# Patient Record
Sex: Female | Born: 1950 | Race: White | Hispanic: No
Health system: Southern US, Community
[De-identification: ages and names within clinical notes are randomized; demographics above are authoritative.]

## PROBLEM LIST (undated history)

## (undated) DIAGNOSIS — G4730 Sleep apnea, unspecified: Secondary | ICD-10-CM

## (undated) DIAGNOSIS — M1990 Unspecified osteoarthritis, unspecified site: Secondary | ICD-10-CM

## (undated) DIAGNOSIS — G4733 Obstructive sleep apnea (adult) (pediatric): Secondary | ICD-10-CM

## (undated) HISTORY — PX: BUNIONECTOMY: SHX129

## (undated) HISTORY — PX: COLONOSCOPY: SHX174

---

## 2004-06-29 DIAGNOSIS — R42 Dizziness and giddiness: Secondary | ICD-10-CM

## 2004-06-29 HISTORY — DX: Dizziness and giddiness: R42

## 2005-01-22 ENCOUNTER — Ambulatory Visit: Payer: Self-pay | Admitting: Family Medicine

## 2005-04-21 ENCOUNTER — Emergency Department: Payer: Self-pay | Admitting: Unknown Physician Specialty

## 2005-04-21 ENCOUNTER — Other Ambulatory Visit: Payer: Self-pay

## 2005-04-21 ENCOUNTER — Emergency Department: Payer: Self-pay | Admitting: Emergency Medicine

## 2005-09-29 ENCOUNTER — Encounter: Payer: Self-pay | Admitting: Family Medicine

## 2005-10-27 ENCOUNTER — Encounter: Payer: Self-pay | Admitting: Family Medicine

## 2005-11-27 ENCOUNTER — Encounter: Payer: Self-pay | Admitting: Family Medicine

## 2005-12-27 ENCOUNTER — Encounter: Payer: Self-pay | Admitting: Family Medicine

## 2006-01-27 ENCOUNTER — Encounter: Payer: Self-pay | Admitting: Family Medicine

## 2006-02-25 ENCOUNTER — Ambulatory Visit: Payer: Self-pay | Admitting: Family Medicine

## 2006-02-27 ENCOUNTER — Encounter: Payer: Self-pay | Admitting: Family Medicine

## 2006-03-29 ENCOUNTER — Encounter: Payer: Self-pay | Admitting: Family Medicine

## 2006-04-29 ENCOUNTER — Encounter: Payer: Self-pay | Admitting: Family Medicine

## 2007-11-07 ENCOUNTER — Other Ambulatory Visit: Payer: Self-pay

## 2007-11-07 ENCOUNTER — Emergency Department: Payer: Self-pay | Admitting: Emergency Medicine

## 2008-05-29 ENCOUNTER — Ambulatory Visit: Payer: Self-pay | Admitting: Family Medicine

## 2010-10-24 ENCOUNTER — Ambulatory Visit: Payer: Self-pay | Admitting: Gastroenterology

## 2012-08-16 ENCOUNTER — Ambulatory Visit: Payer: Self-pay | Admitting: General Practice

## 2013-03-14 ENCOUNTER — Ambulatory Visit: Payer: Self-pay | Admitting: Family Medicine

## 2013-03-22 ENCOUNTER — Ambulatory Visit: Payer: Self-pay | Admitting: Family Medicine

## 2013-10-12 ENCOUNTER — Ambulatory Visit: Payer: Self-pay | Admitting: Family Medicine

## 2014-03-20 ENCOUNTER — Ambulatory Visit: Payer: Self-pay | Admitting: Family Medicine

## 2014-05-11 ENCOUNTER — Emergency Department: Payer: Self-pay | Admitting: Emergency Medicine

## 2014-05-11 LAB — COMPREHENSIVE METABOLIC PANEL
ALBUMIN: 3.1 g/dL — AB (ref 3.4–5.0)
AST: 30 U/L (ref 15–37)
Alkaline Phosphatase: 97 U/L
Anion Gap: 4 — ABNORMAL LOW (ref 7–16)
BUN: 13 mg/dL (ref 7–18)
Bilirubin,Total: 0.4 mg/dL (ref 0.2–1.0)
CO2: 29 mmol/L (ref 21–32)
CREATININE: 0.94 mg/dL (ref 0.60–1.30)
Calcium, Total: 8.1 mg/dL — ABNORMAL LOW (ref 8.5–10.1)
Chloride: 107 mmol/L (ref 98–107)
EGFR (African American): >60
EGFR (Non-African Amer.): >60
Glucose: 112 mg/dL — ABNORMAL HIGH (ref 65–99)
OSMOLALITY: 280 (ref 275–301)
Potassium: 4.1 mmol/L (ref 3.5–5.1)
SGPT (ALT): 25 U/L
SODIUM: 140 mmol/L (ref 136–145)
Total Protein: 6.7 g/dL (ref 6.4–8.2)

## 2014-05-11 LAB — CK TOTAL AND CKMB (NOT AT ARMC)
CK, Total: 50 U/L
CK-MB: 0.5 ng/mL (ref 0.5–3.6)

## 2014-05-11 LAB — CBC
HCT: 43 % (ref 35.0–47.0)
HGB: 13.9 g/dL (ref 12.0–16.0)
MCH: 27.4 pg (ref 26.0–34.0)
MCHC: 32.4 g/dL (ref 32.0–36.0)
MCV: 85 fL (ref 80–100)
Platelet: 229 10*3/uL (ref 150–440)
RBC: 5.08 10*6/uL (ref 3.80–5.20)
RDW: 13.8 % (ref 11.5–14.5)
WBC: 7.7 10*3/uL (ref 3.6–11.0)

## 2014-05-11 LAB — TROPONIN I: Troponin-I: <0.02 ng/mL

## 2015-03-15 ENCOUNTER — Other Ambulatory Visit: Payer: Self-pay | Admitting: Family Medicine

## 2015-03-15 DIAGNOSIS — R921 Mammographic calcification found on diagnostic imaging of breast: Secondary | ICD-10-CM

## 2015-03-28 ENCOUNTER — Ambulatory Visit: Payer: Medicare Other

## 2015-03-28 ENCOUNTER — Ambulatory Visit
Admission: RE | Admit: 2015-03-28 | Discharge: 2015-03-28 | Disposition: A | Discharge status: Home / Self Care | Payer: Medicare Other | Source: Ambulatory Visit | Attending: Family Medicine | Admitting: Family Medicine

## 2015-03-28 DIAGNOSIS — R921 Mammographic calcification found on diagnostic imaging of breast: Secondary | ICD-10-CM | POA: Diagnosis not present

## 2015-09-26 IMAGING — CR DG CHEST 2V
1 series · 2 of 2 positions shown · non-contrast
Comparison: None.

CLINICAL DATA: Chest pain

EXAM:
CHEST  2 VIEW

[Series 1: w chest pa · 0.14mm/px · 2 of 2 slices shown]
[im 1/2]
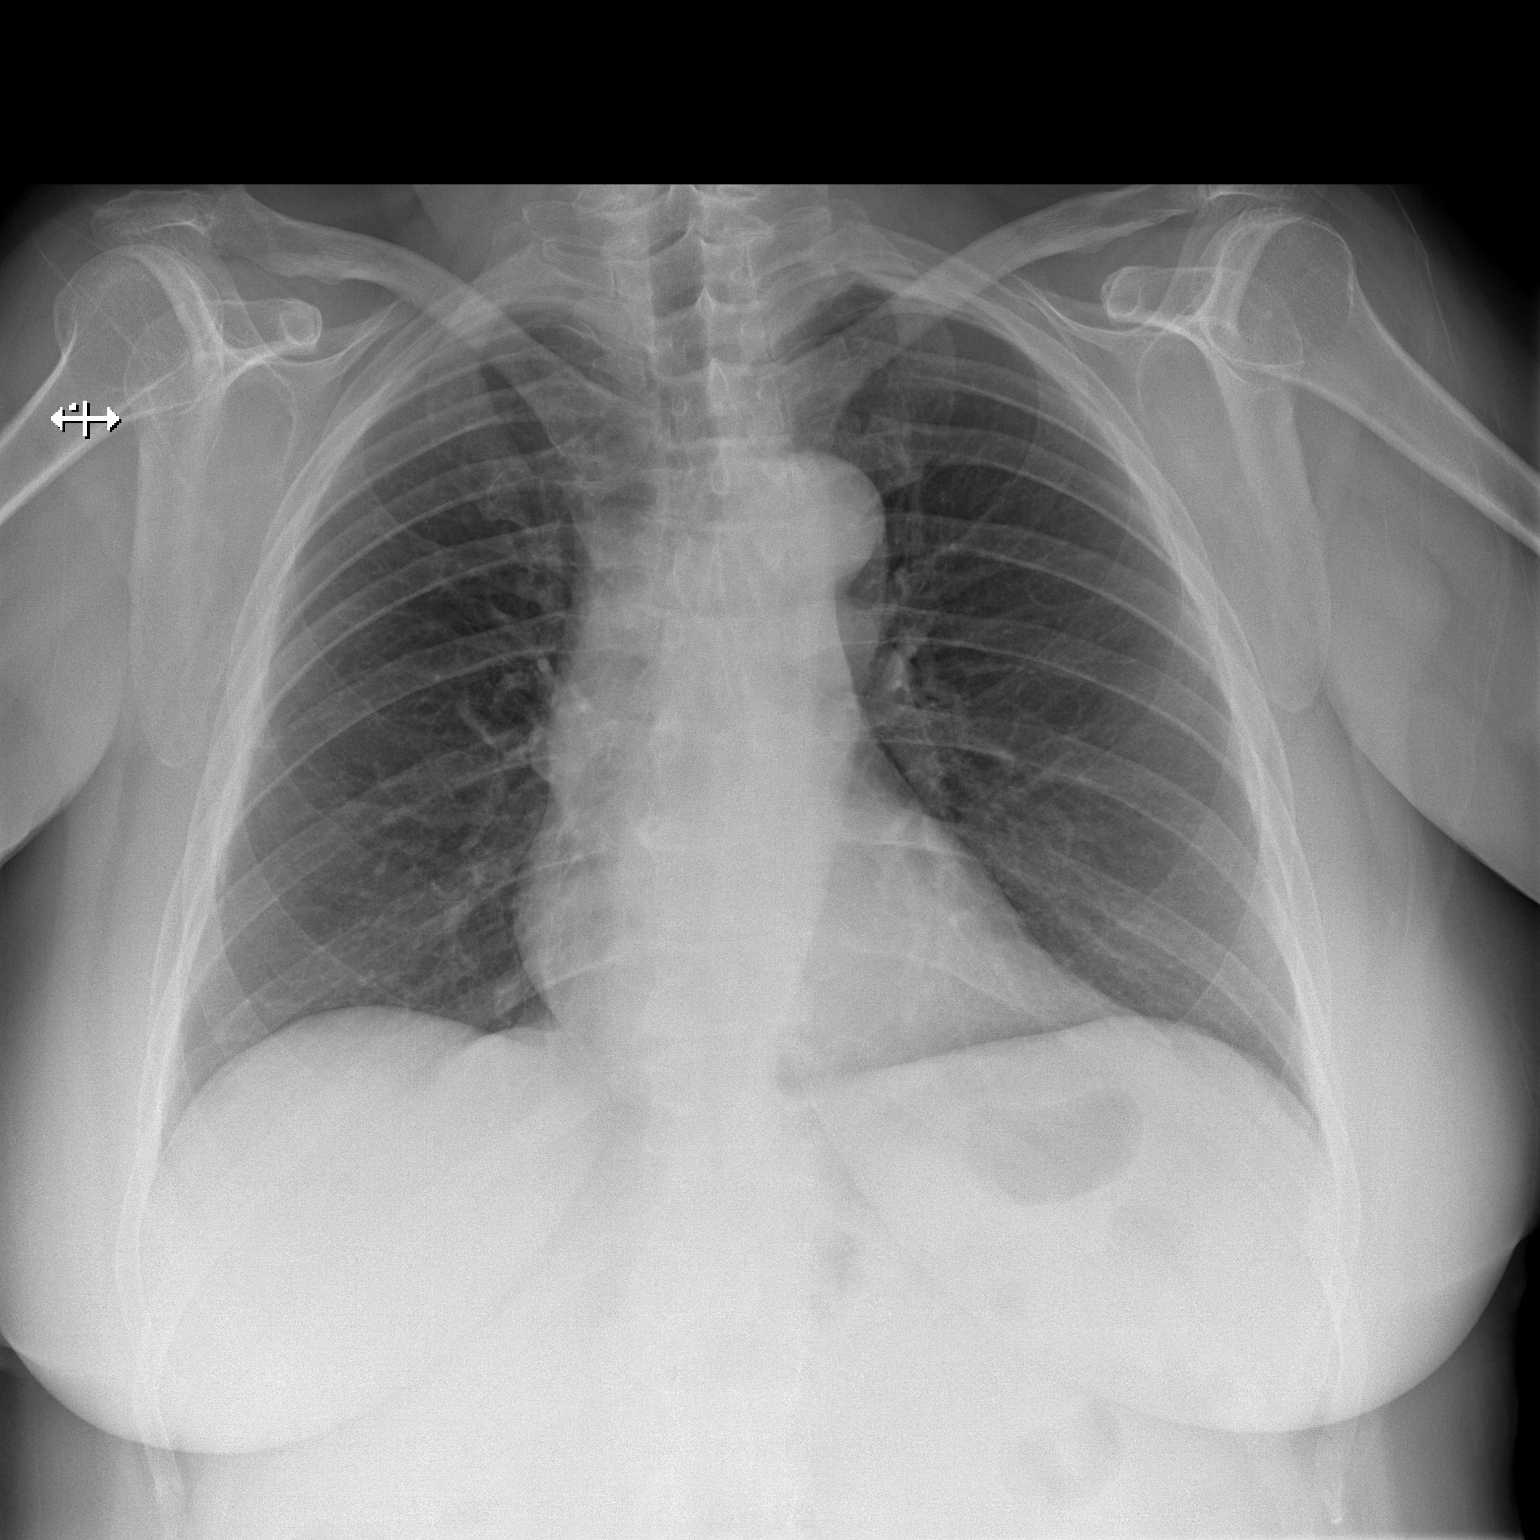
[im 2/2]
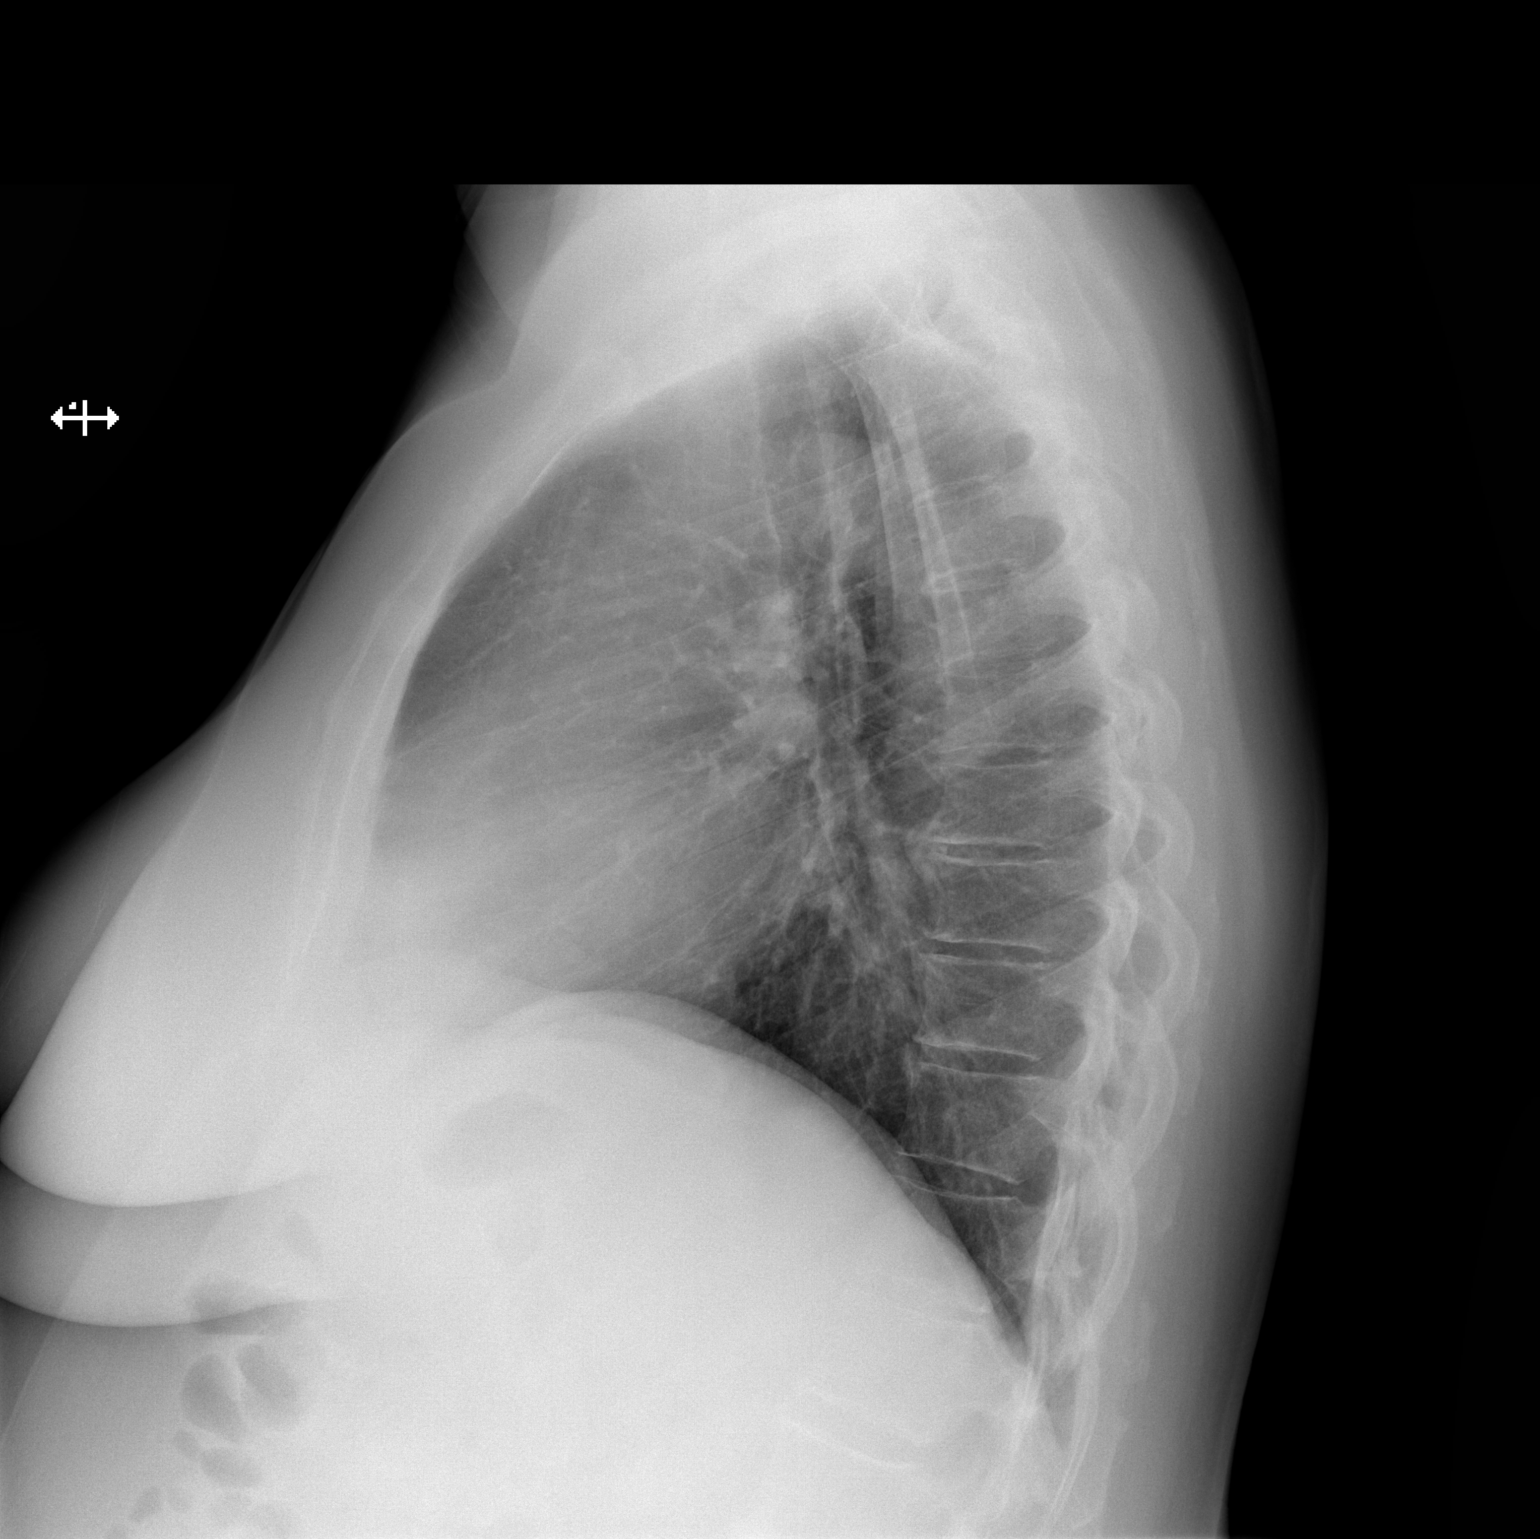

[2 of 2 positions shown; findings below may reference images not displayed]

FINDINGS: Lungs are clear.  No pleural effusion or pneumothorax.

The heart is normal in size.

Mild degenerative changes of the visualized thoracolumbar spine.
IMPRESSION: No evidence of acute cardiopulmonary disease.

## 2016-03-17 ENCOUNTER — Other Ambulatory Visit: Payer: Self-pay | Admitting: Family Medicine

## 2016-03-17 DIAGNOSIS — Z1231 Encounter for screening mammogram for malignant neoplasm of breast: Secondary | ICD-10-CM

## 2016-04-01 ENCOUNTER — Ambulatory Visit
Admission: RE | Admit: 2016-04-01 | Discharge: 2016-04-01 | Disposition: A | Discharge status: Home / Self Care | Payer: Medicare HMO | Source: Ambulatory Visit | Attending: Family Medicine | Admitting: Family Medicine

## 2016-04-01 DIAGNOSIS — Z1231 Encounter for screening mammogram for malignant neoplasm of breast: Secondary | ICD-10-CM | POA: Diagnosis not present

## 2016-10-30 ENCOUNTER — Ambulatory Visit (INDEPENDENT_AMBULATORY_CARE_PROVIDER_SITE_OTHER): Payer: Medicare HMO

## 2016-10-30 ENCOUNTER — Ambulatory Visit (INDEPENDENT_AMBULATORY_CARE_PROVIDER_SITE_OTHER): Payer: Medicare HMO | Admitting: Podiatry

## 2016-10-30 DIAGNOSIS — S93401A Sprain of unspecified ligament of right ankle, initial encounter: Secondary | ICD-10-CM

## 2016-10-30 DIAGNOSIS — M659 Synovitis and tenosynovitis, unspecified: Secondary | ICD-10-CM

## 2016-10-30 DIAGNOSIS — M25571 Pain in right ankle and joints of right foot: Secondary | ICD-10-CM | POA: Diagnosis not present

## 2016-10-30 DIAGNOSIS — M7751 Other enthesopathy of right foot: Secondary | ICD-10-CM

## 2016-11-02 NOTE — Progress Notes (Signed)
   Subjective:  Patient presents today for dull, aching pain and tenderness to the right ankle and foot that has been present for the past 2 months. She states she fell down some stairs two months ago and was seen at [facility] and was diagnosed with a right ankle sprain. She reports she is having continued swelling and pain. Walking for long periods of time increases the pain. She has been wrapping the ankle with an ace bandage, applying ice therapy and taking Ibuprofen with some relief. Patient presents for further treatment and evaluation.    Objective / Physical Exam:  General:  The patient is alert and oriented x3 in no acute distress. Dermatology:  Skin is warm, dry and supple bilateral lower extremities. Negative for open lesions or macerations. Vascular:  Palpable pedal pulses bilaterally. No edema or erythema noted. Capillary refill within normal limits. Neurological:  Epicritic and protective threshold grossly intact bilaterally.  Musculoskeletal Exam:  Pain on palpation to the anterior lateral medial aspects of the patient's left ankle. Mild edema noted.  Range of motion within normal limits to all pedal and ankle joints bilateral. Muscle strength 5/5 in all groups bilateral.   Radiographic Exam:  Normal osseous mineralization. Joint spaces preserved. No fracture/dislocation/boney destruction.  Assessment: #1 pain in right ankle #2 synovitis of right ankle #3 h/o right ankle sprain  Plan of Care:  #1 Patient was evaluated. #2 injection of 0.5 mL Celestone Soluspan injected in the patient's right ankle. #3 Compression ankle dispensed #4 ankle brace dispensed #5 Continue wearing Brookes running shoes #6 patient is to return to clinic in 3 weeks  Pt is going to [place] on May 30th.  Felecia ShellingBrent M. Ludella Pranger, DPM [facility]  Dr. Felecia ShellingBrent M. Leticia Coletta, DPM    [address]                                        [place], [place] [address]                Office [phone]  [phone]  Fax [phone]) [phone]

## 2016-11-10 MED ORDER — BETAMETHASONE SOD PHOS & ACET 6 (3-3) MG/ML IJ SUSP: 3.0000 mg | Freq: Once | INTRAMUSCULAR | Status: DC

## 2016-11-20 ENCOUNTER — Encounter: Payer: Self-pay | Admitting: Podiatry

## 2016-11-20 ENCOUNTER — Ambulatory Visit (INDEPENDENT_AMBULATORY_CARE_PROVIDER_SITE_OTHER): Payer: Medicare HMO | Admitting: Podiatry

## 2016-11-20 DIAGNOSIS — M659 Synovitis and tenosynovitis, unspecified: Secondary | ICD-10-CM

## 2016-11-23 NOTE — Progress Notes (Signed)
   Subjective:  Patient presents today for follow-up evaluation for pain and tenderness to the right ankle. She states she is somewhat better but she is still having some pain across the top of the ankle and swelling on the lateral side. She has been wearing the ankle brace with some relief of the pain.   Objective / Physical Exam:  General:  The patient is alert and oriented x3 in no acute distress. Dermatology:  Skin is warm, dry and supple bilateral lower extremities. Negative for open lesions or macerations. Vascular:  Palpable pedal pulses bilaterally. No edema or erythema noted. Capillary refill within normal limits. Neurological:  Epicritic and protective threshold grossly intact bilaterally.  Musculoskeletal Exam:  Pain on palpation to the anterior lateral medial aspects of the patient's right ankle. Mild edema noted.  Range of motion within normal limits to all pedal and ankle joints bilateral. Muscle strength 5/5 in all groups bilateral.   Radiographic Exam:  Normal osseous mineralization. Joint spaces preserved. No fracture/dislocation/boney destruction.    Assessment: #1 pain in right ankle #2 synovitis of right ankle  Plan of Care:  #1 Patient was evaluated. #2 injection of 0.5 mL Celestone Soluspan injected in the patient's right ankle. #3 continue Brookes shoes and ankle brace. #4 return to clinic when necessary.  Patient is going to [place] on May 30.  Felecia ShellingBrent M. Pairlee Sawtell, DPM [facility]  Dr. Felecia ShellingBrent M. Mahaila Tischer, DPM    [address]                                        [place], [place] [address]                Office [phone]) [phone]  Fax [phone]

## 2016-11-26 MED ORDER — BETAMETHASONE SOD PHOS & ACET 6 (3-3) MG/ML IJ SUSP: 3.0000 mg | Freq: Once | INTRAMUSCULAR | Status: DC

## 2017-03-19 ENCOUNTER — Other Ambulatory Visit: Payer: Self-pay | Admitting: Family Medicine

## 2017-03-19 DIAGNOSIS — Z1231 Encounter for screening mammogram for malignant neoplasm of breast: Secondary | ICD-10-CM

## 2017-04-05 ENCOUNTER — Ambulatory Visit
Admission: RE | Admit: 2017-04-05 | Discharge: 2017-04-05 | Disposition: A | Discharge status: Home / Self Care | Payer: Medicare HMO | Source: Ambulatory Visit | Attending: Family Medicine | Admitting: Family Medicine

## 2017-04-05 DIAGNOSIS — Z1231 Encounter for screening mammogram for malignant neoplasm of breast: Secondary | ICD-10-CM | POA: Insufficient documentation

## 2019-08-14 ENCOUNTER — Ambulatory Visit: Payer: Medicare Other | Attending: Internal Medicine

## 2019-08-14 DIAGNOSIS — Z23 Encounter for immunization: Secondary | ICD-10-CM | POA: Insufficient documentation

## 2019-08-14 NOTE — Progress Notes (Signed)
   Covid-19 Vaccination Clinic  Name:  Monica Reid    MRN: [id number] DOB: 29-Aug-1950  08/14/2019  Monica Reid was observed post Covid-19 immunization for 15 minutes without incidence. She was provided with Vaccine Information Sheet and instruction to access the V-Safe system.   Monica Reid was instructed to call 911 with any severe reactions post vaccine: Marland Kitchen Difficulty breathing  . Swelling of your face and throat  . A fast heartbeat  . A bad rash all over your body  . Dizziness and weakness    Immunizations Administered    Name Date Dose VIS Date Route   Moderna COVID-19 Vaccine 08/14/2019 11:59 AM 0.5 mL 05/30/2019 Intramuscular   Manufacturer: Gala Murdoch   Lot: [id number]   NDC: 19012-224-11     s

## 2019-09-04 ENCOUNTER — Other Ambulatory Visit: Payer: Self-pay | Admitting: Family Medicine

## 2019-09-04 DIAGNOSIS — Z1231 Encounter for screening mammogram for malignant neoplasm of breast: Secondary | ICD-10-CM

## 2019-09-12 ENCOUNTER — Other Ambulatory Visit: Payer: Self-pay

## 2019-09-12 ENCOUNTER — Ambulatory Visit: Payer: Medicare Other | Attending: Internal Medicine

## 2019-09-12 DIAGNOSIS — Z23 Encounter for immunization: Secondary | ICD-10-CM

## 2019-09-12 NOTE — Progress Notes (Signed)
   Covid-19 Vaccination Clinic  Name:  [name]    MRN: [id number] DOB: Feb 16, 1951  09/12/2019  [name] was observed post Covid-19 immunization for 15 minutes without incident. She was provided with Vaccine Information Sheet and instruction to access the V-Safe system.   [name] was instructed to call 911 with any severe reactions post vaccine: Marland Kitchen Difficulty breathing  . Swelling of face and throat  . A fast heartbeat  . A bad rash all over body  . Dizziness and weakness   Immunizations Administered    Name Date Dose VIS Date Route   Moderna COVID-19 Vaccine 09/12/2019 11:43 AM 0.5 mL 05/30/2019 Intramuscular   Manufacturer: Moderna   Lot: [id number]   NDC: 81388-719-59

## 2019-11-01 ENCOUNTER — Ambulatory Visit
Admission: RE | Admit: 2019-11-01 | Discharge: 2019-11-01 | Disposition: A | Discharge status: Home / Self Care | Payer: Medicare HMO | Source: Ambulatory Visit | Attending: Family Medicine | Admitting: Family Medicine

## 2019-11-01 DIAGNOSIS — Z1231 Encounter for screening mammogram for malignant neoplasm of breast: Secondary | ICD-10-CM | POA: Insufficient documentation

## 2021-03-18 IMAGING — MG DIGITAL SCREENING BILAT W/ TOMO W/ CAD
6 of 10 series · 6 of 30 positions shown · non-contrast
Comparison: Previous exam(s).

ACR Breast Density Category a: The breast tissue is almost entirely
fatty.

CLINICAL DATA: Screening.

EXAM:
DIGITAL SCREENING BILATERAL MAMMOGRAM WITH TOMO AND CAD

[L CC synth-2D (1 of 2)]
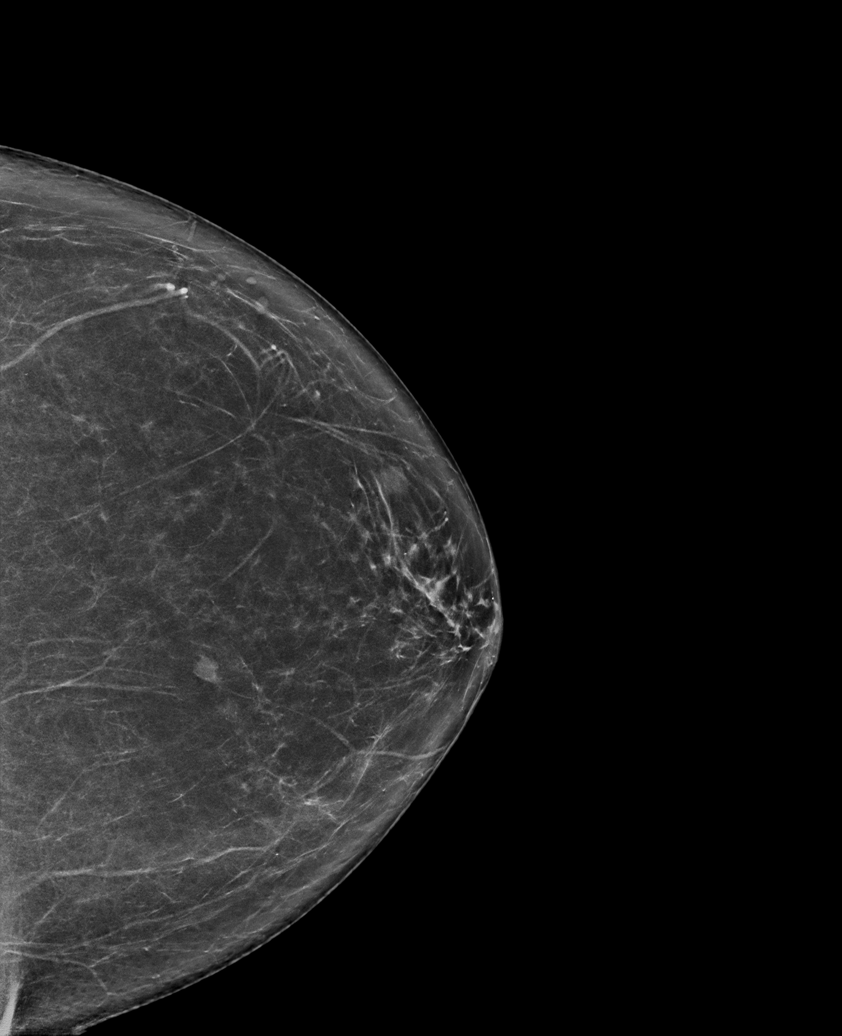

[L CC synth-2D (2 of 2)]
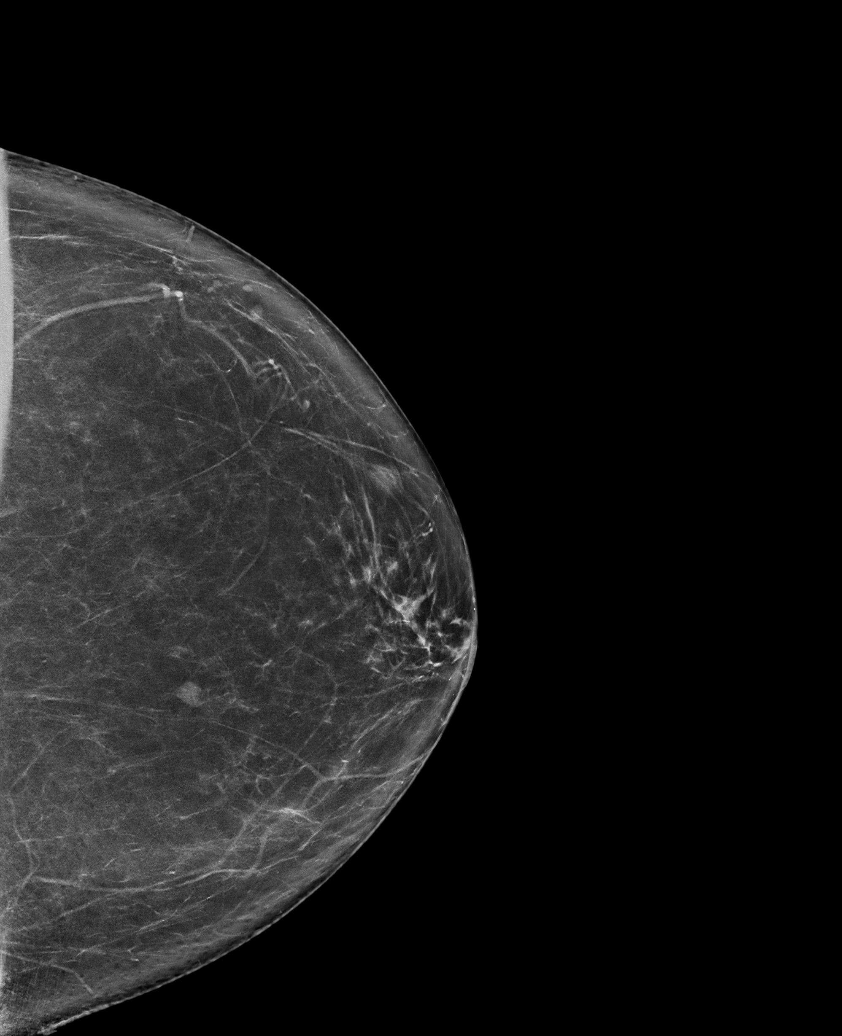

[R CC synth-2D]
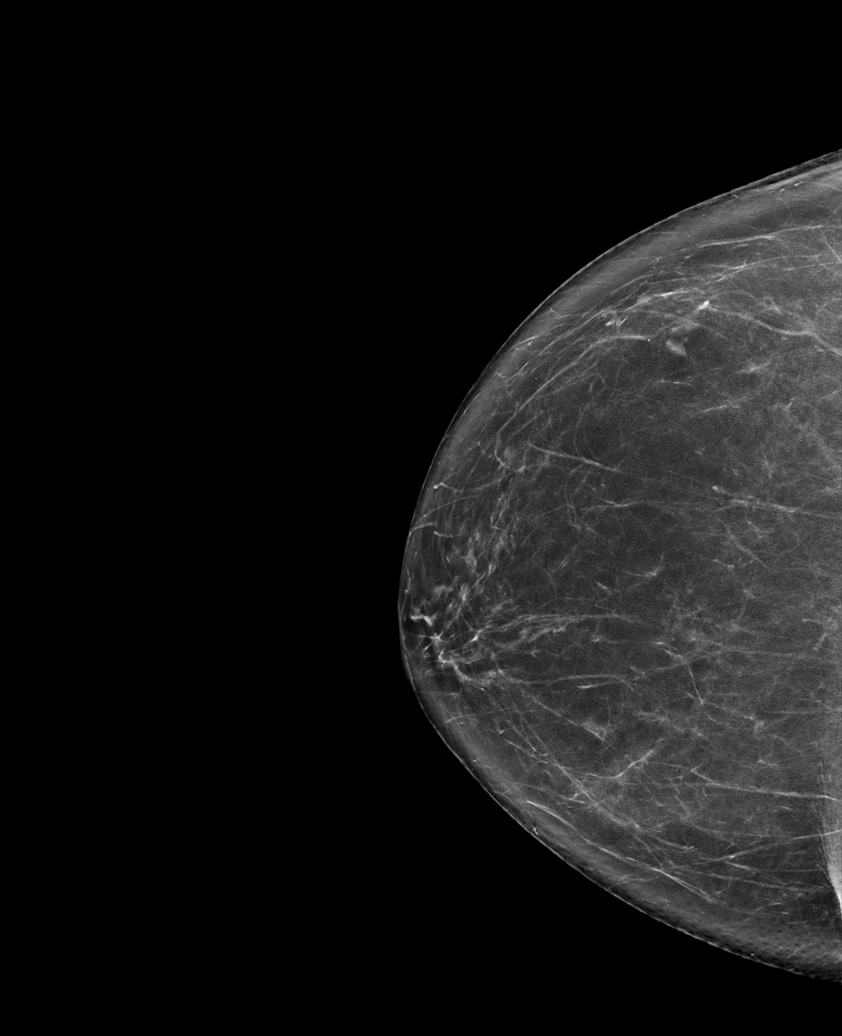

[L MLO synth-2D]
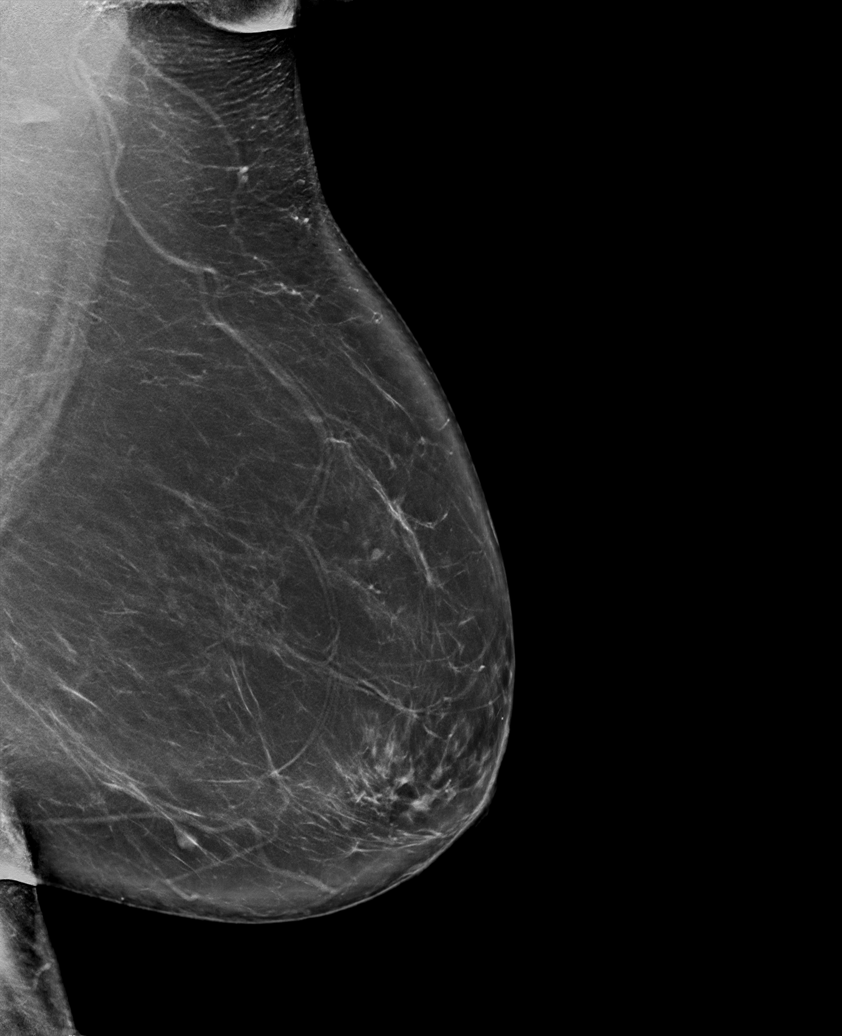

[R MLO synth-2D]
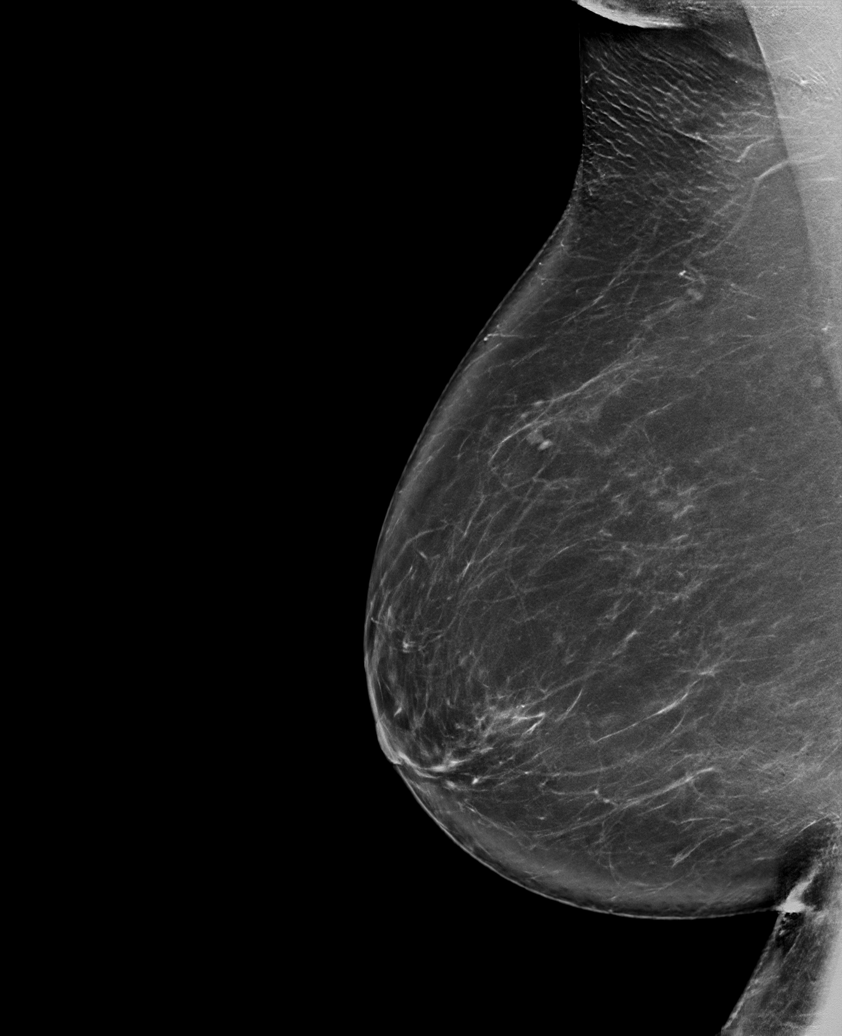

[L CC tomo · tomo slice 40/79.0]
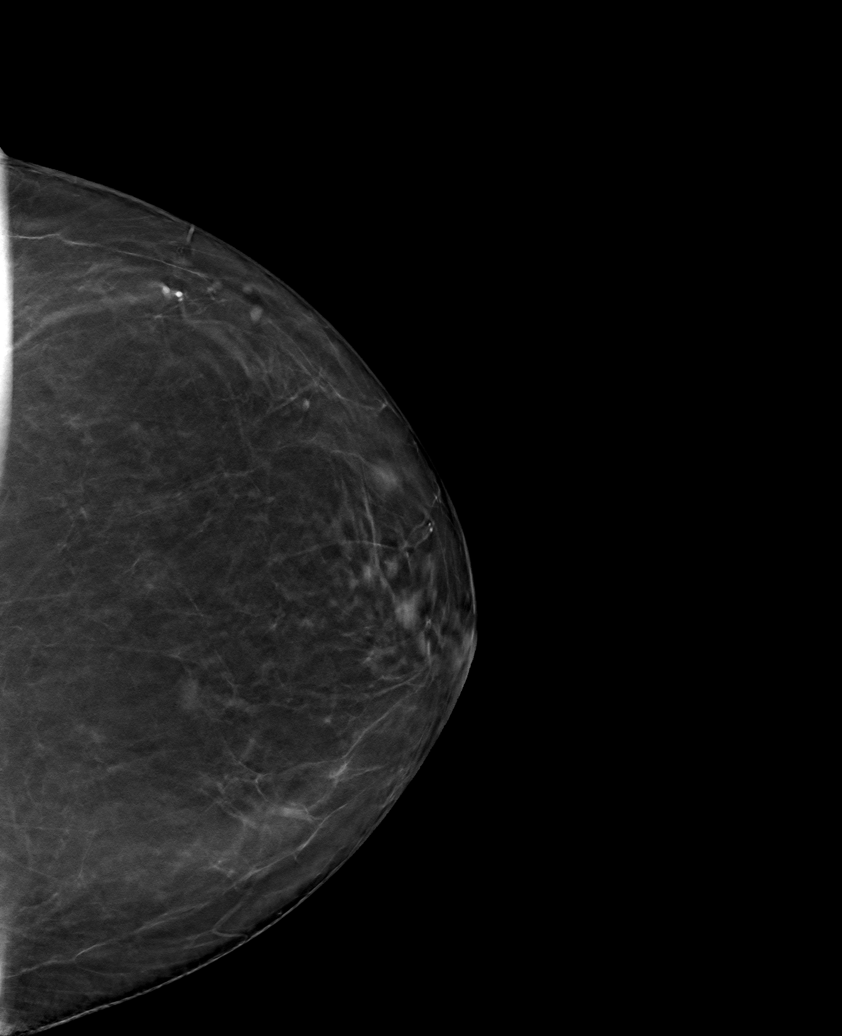

[6 of 30 positions shown; findings below may reference images not displayed]

FINDINGS: There are no findings suspicious for malignancy. Images were
processed with CAD.
IMPRESSION: No mammographic evidence of malignancy. A result letter of this
screening mammogram will be mailed directly to the patient.

RECOMMENDATION:
Screening mammogram in one year. (Code:[id number])

BI-RADS CATEGORY  1: Negative.

## 2023-08-11 ENCOUNTER — Encounter: Payer: Self-pay | Admitting: Ophthalmology

## 2023-08-11 ENCOUNTER — Other Ambulatory Visit: Payer: Self-pay

## 2023-08-11 NOTE — Anesthesia Preprocedure Evaluation (Addendum)
 Anesthesia Evaluation  Patient identified by MRN, date of birth, ID band Patient awake    Reviewed: Allergy & Precautions, H&P , NPO status , Patient's Chart, lab work & pertinent test results  Airway Mallampati: IV  TM Distance: <3 FB Neck ROM: Full    Dental no notable dental hx.    Pulmonary neg pulmonary ROS, sleep apnea    Pulmonary exam normal breath sounds clear to auscultation       Cardiovascular negative cardio ROS Normal cardiovascular exam Rhythm:Regular Rate:Normal     Neuro/Psych negative neurological ROS  negative psych ROS   GI/Hepatic negative GI ROS, Neg liver ROS,,,  Endo/Other  negative endocrine ROS    Renal/GU negative Renal ROS  negative genitourinary   Musculoskeletal negative musculoskeletal ROS (+) Arthritis ,    Abdominal   Peds negative pediatric ROS (+)  Hematology negative hematology ROS (+)   Anesthesia Other Findings Sleep apnea on CPAP Vertigo Arthritis Morbid obesity   States has PTSD from previous eye surgery, very anxious  Reproductive/Obstetrics negative OB ROS                             Anesthesia Physical Anesthesia Plan  ASA: 2  Anesthesia Plan: MAC   Post-op Pain Management:    Induction: Intravenous  PONV Risk Score and Plan:   Airway Management Planned: Natural Airway and Nasal Cannula  Additional Equipment:   Intra-op Plan:   Post-operative Plan:   Informed Consent: I have reviewed the patients History and Physical, chart, labs and discussed the procedure including the risks, benefits and alternatives for the proposed anesthesia with the patient or authorized representative who has indicated his/her understanding and acceptance.     Dental Advisory Given  Plan Discussed with: Anesthesiologist, CRNA and Surgeon  Anesthesia Plan Comments: (Patient consented for risks of anesthesia including but not limited to:  - adverse  reactions to medications - damage to eyes, teeth, lips or other oral mucosa - nerve damage due to positioning  - sore throat or hoarseness - Damage to heart, brain, nerves, lungs, other parts of body or loss of life  Patient voiced understanding and assent.)       Anesthesia Quick Evaluation

## 2023-08-24 ENCOUNTER — Ambulatory Visit: Payer: Medicare HMO | Admitting: Anesthesiology

## 2023-08-24 ENCOUNTER — Encounter
Admission: RE | Disposition: A | Discharge status: Home / Self Care | Payer: Self-pay | Source: Home / Self Care | Attending: Ophthalmology

## 2023-08-24 ENCOUNTER — Encounter: Payer: Self-pay | Admitting: Ophthalmology

## 2023-08-24 ENCOUNTER — Ambulatory Visit
Admission: RE | Admit: 2023-08-24 | Discharge: 2023-08-24 | Disposition: A | Discharge status: Home / Self Care | Payer: Medicare HMO | Attending: Ophthalmology | Admitting: Ophthalmology

## 2023-08-24 ENCOUNTER — Other Ambulatory Visit: Payer: Self-pay

## 2023-08-24 DIAGNOSIS — H2512 Age-related nuclear cataract, left eye: Secondary | ICD-10-CM | POA: Insufficient documentation

## 2023-08-24 DIAGNOSIS — G4733 Obstructive sleep apnea (adult) (pediatric): Secondary | ICD-10-CM | POA: Diagnosis not present

## 2023-08-24 HISTORY — PX: CATARACT EXTRACTION W/PHACO: SHX586

## 2023-08-24 HISTORY — DX: Unspecified osteoarthritis, unspecified site: M19.90

## 2023-08-24 HISTORY — DX: Morbid (severe) obesity due to excess calories: E66.01

## 2023-08-24 HISTORY — DX: Obstructive sleep apnea (adult) (pediatric): G47.33

## 2023-08-24 HISTORY — DX: Sleep apnea, unspecified: G47.30

## 2023-08-24 SURGERY — PHACOEMULSIFICATION, CATARACT, WITH IOL INSERTION
Anesthesia: Monitor Anesthesia Care | Site: Eye | Laterality: Left

## 2023-08-24 MED ORDER — MIDAZOLAM HCL 2 MG/2ML IJ SOLN
INTRAMUSCULAR | Status: DC | PRN
  Administered 2023-08-24 (×2): 1 mg via INTRAVENOUS

## 2023-08-24 MED ORDER — SIGHTPATH DOSE#1 BSS IO SOLN
INTRAOCULAR | Status: DC | PRN
  Administered 2023-08-24: 50 mL via OPHTHALMIC

## 2023-08-24 MED ORDER — SIGHTPATH DOSE#1 BSS IO SOLN
INTRAOCULAR | Status: DC | PRN
  Administered 2023-08-24: 15 mL via INTRAOCULAR

## 2023-08-24 MED ORDER — TETRACAINE HCL 0.5 % OP SOLN
1.0000 [drp] | OPHTHALMIC | Status: DC | PRN
  Administered 2023-08-24 (×3): 1 [drp] via OPHTHALMIC

## 2023-08-24 MED ORDER — BRIMONIDINE TARTRATE-TIMOLOL 0.2-0.5 % OP SOLN
OPHTHALMIC | Status: DC | PRN
  Administered 2023-08-24: 1 [drp] via OPHTHALMIC

## 2023-08-24 MED ORDER — ARMC OPHTHALMIC DILATING DROPS
OPHTHALMIC | Status: AC
  Filled 2023-08-24: qty 0.5

## 2023-08-24 MED ORDER — FENTANYL CITRATE (PF) 100 MCG/2ML IJ SOLN
INTRAMUSCULAR | Status: AC
  Filled 2023-08-24: qty 2

## 2023-08-24 MED ORDER — FENTANYL CITRATE (PF) 100 MCG/2ML IJ SOLN
INTRAMUSCULAR | Status: DC | PRN
Start: 2023-08-24 — End: 2023-08-24
  Administered 2023-08-24: 50 ug via INTRAVENOUS

## 2023-08-24 MED ORDER — ARMC OPHTHALMIC DILATING DROPS
1.0000 | OPHTHALMIC | Status: DC | PRN
  Administered 2023-08-24 (×3): 1 via OPHTHALMIC

## 2023-08-24 MED ORDER — TETRACAINE HCL 0.5 % OP SOLN
OPHTHALMIC | Status: AC
  Filled 2023-08-24: qty 4

## 2023-08-24 MED ORDER — MIDAZOLAM HCL 2 MG/2ML IJ SOLN
INTRAMUSCULAR | Status: AC
  Filled 2023-08-24: qty 2

## 2023-08-24 MED ORDER — SIGHTPATH DOSE#1 BSS IO SOLN
INTRAOCULAR | Status: DC | PRN
  Administered 2023-08-24: 2 mL

## 2023-08-24 MED ORDER — SIGHTPATH DOSE#1 NA CHONDROIT SULF-NA HYALURON 40-17 MG/ML IO SOLN
INTRAOCULAR | Status: DC | PRN
  Administered 2023-08-24: 1 mL via INTRAOCULAR

## 2023-08-24 MED ORDER — MOXIFLOXACIN HCL 0.5 % OP SOLN
OPHTHALMIC | Status: DC | PRN
  Administered 2023-08-24: .2 mL via OPHTHALMIC

## 2023-08-24 SURGICAL SUPPLY — 15 items
CANNULA ANT/CHMB 27G (MISCELLANEOUS) IMPLANT
CANNULA ANT/CHMB 27GA (MISCELLANEOUS) IMPLANT
CATARACT SUITE SIGHTPATH (MISCELLANEOUS) ×1 IMPLANT
CYSTOTOME ANG REV CUT SHRT 25G (CUTTER) ×1 IMPLANT
CYSTOTOME ANGL RVRS SHRT 25G (CUTTER) ×1 IMPLANT
FEE CATARACT SUITE SIGHTPATH (MISCELLANEOUS) ×1 IMPLANT
GLOVE BIOGEL PI IND STRL 8 (GLOVE) ×1 IMPLANT
GLOVE SURG LX STRL 8.0 MICRO (GLOVE) ×1 IMPLANT
GLOVE SURG PROTEXIS BL SZ6.5 (GLOVE) ×1 IMPLANT
GLOVE SURG SYN 6.5 PF PI BL (GLOVE) ×1 IMPLANT
LENS CLRN VIVITY TORC 24.5 ×1 IMPLANT
LENS IOL CLRN VT TRC 3 24.5 IMPLANT
NDL FILTER BLUNT 18X1 1/2 (NEEDLE) ×1 IMPLANT
NEEDLE FILTER BLUNT 18X1 1/2 (NEEDLE) ×1 IMPLANT
SYR 3ML LL SCALE MARK (SYRINGE) ×1 IMPLANT

## 2023-08-24 NOTE — Anesthesia Postprocedure Evaluation (Signed)
 Anesthesia Post Note  Patient: Monica Reid  Procedure(s) Performed: CATARACT EXTRACTION PHACO AND INTRAOCULAR LENS PLACEMENT (IOC) LEFT  CLAREON VIVITY TORIC 9.52 00:48.1 (Left: Eye)  Patient location during evaluation: PACU Anesthesia Type: MAC Level of consciousness: awake and alert Pain management: pain level controlled Vital Signs Assessment: post-procedure vital signs reviewed and stable Respiratory status: spontaneous breathing, nonlabored ventilation, respiratory function stable and patient connected to nasal cannula oxygen Cardiovascular status: stable and blood pressure returned to baseline Postop Assessment: no apparent nausea or vomiting Anesthetic complications: no   No notable events documented.   Last Vitals:  Vitals:   08/24/23 1217 08/24/23 1222  BP: 128/68 134/71  Pulse: 84 81  Resp: (!) 24 20  Temp: (!) 36.2 C (!) 36.2 C  SpO2: 96% 93%    Last Pain:  Vitals:   08/24/23 1222  TempSrc:   PainSc: 0-No pain                 Austin Herd C Anjana Cheek

## 2023-08-24 NOTE — Op Note (Signed)
 PREOPERATIVE DIAGNOSIS:  Nuclear sclerotic cataract of the left eye.   POSTOPERATIVE DIAGNOSIS:  Nuclear sclerotic cataract of the left eye.   OPERATIVE PROCEDURE: Procedure(s): CATARACT EXTRACTION PHACO AND INTRAOCULAR LENS PLACEMENT (IOC) LEFT  CLAREON VIVITY TORIC 9.52 00:48.1   SURGEON:  Galen Manila, MD.   ANESTHESIA: 1.      Managed anesthesia care. 2.     0.25ml os Shugarcaine was instilled following the paracentesis [email or web address]@   COMPLICATIONS:  None.   TECHNIQUE:   Stop and chop    DESCRIPTION OF PROCEDURE:  The patient was examined and consented in the preoperative holding area where the aforementioned topical anesthesia was applied to the left eye.  The patient was brought back to the Operating Room where he was sat upright on the gurney and given a target to fixate upon while the eye was marked at the 3:00 and 9:00 position.  The patient was then reclined on the operating table.  The eye was prepped and draped in the usual sterile ophthalmic fashion and a lid speculum was placed. A paracentesis was created with the side port blade and the anterior chamber was filled with viscoelastic. A near clear corneal incision was performed with the steel keratome. A continuous curvilinear capsulorrhexis was performed with a cystotome followed by the capsulorrhexis forceps. Hydrodissection and hydrodelineation were carried out with BSS on a blunt cannula. The lens was removed in a stop and chop technique and the remaining cortical material was removed with the irrigation-aspiration handpiece. The eye was inflated with viscoelastic and the CNWET lens was placed in the eye and rotated to within a few degrees of the predetermined orientation.  The remaining viscoelastic was removed from the eye.  The Sinskey hook was used to rotate the toric lens into its final resting place at150 degrees.  0.1 ml of Vigamox was placed in the anterior chamber. The eye was inflated to a physiologic pressure and  found to be watertight.  The eye was dressed with Vigamox. The patient was given protective glasses to wear throughout the day and a shield with which to sleep tonight. The patient was also given drops with which to begin a drop regimen today and will follow-up with me in one day. Implant Name Type Inv. Item Serial No. Manufacturer Lot No. LRB No. Used Action  LENS CLRN VIVITY TORC 24.5 - [id number]  LENS CLRN VIVITY TORC 24.5 [id number] [employer]  Left 1 Implanted   Procedure(s): CATARACT EXTRACTION PHACO AND INTRAOCULAR LENS PLACEMENT (IOC) LEFT  CLAREON VIVITY TORIC 9.52 00:48.1 (Left)  Electronically signed: Galen Manila 2/25/202512:16 PM

## 2023-08-24 NOTE — Transfer of Care (Signed)
 Immediate Anesthesia Transfer of Care Note  Patient: Monica Reid  Procedure(s) Performed: CATARACT EXTRACTION PHACO AND INTRAOCULAR LENS PLACEMENT (IOC) LEFT  CLAREON VIVITY TORIC 9.52 00:48.1 (Left: Eye)  Patient Location: PACU  Anesthesia Type: MAC  Level of Consciousness: awake, alert  and patient cooperative  Airway and Oxygen Therapy: Patient Spontanous Breathing and Patient connected to supplemental oxygen  Post-op Assessment: Post-op Vital signs reviewed, Patient's Cardiovascular Status Stable, Respiratory Function Stable, Patent Airway and No signs of Nausea or vomiting  Post-op Vital Signs: Reviewed and stable  Complications: No notable events documented.

## 2023-08-24 NOTE — Discharge Instructions (Signed)

## 2023-08-24 NOTE — H&P (Signed)
 [facility]   Primary Care Physician:  Jerl Mina, MD Ophthalmologist: Dr. Druscilla Brownie  Pre-Procedure History & Physical: HPI:  Monica Reid is a 73 y.o. female here for cataract surgery.   Past Medical History:  Diagnosis Date   Arthritis    left knee   Morbid obesity (HCC)    Obstructive sleep apnea on CPAP    Sleep apnea    uses CPAP   Vertigo 2006    Past Surgical History:  Procedure Laterality Date   BUNIONECTOMY Right    COLONOSCOPY      Prior to Admission medications   Not on File    Allergies as of 08/05/2023 - Review Complete 11/20/2016  Allergen Reaction Noted   Sulfa antibiotics Rash 05/14/2014    Family History  Problem Relation Age of Onset   Breast cancer Neg Hx     Social History   Socioeconomic History   Marital status: Legally Separated    Spouse name: Not on file   Number of children: Not on file   Years of education: Not on file   Highest education level: Not on file  Occupational History   Not on file  Tobacco Use   Smoking status: Unknown   Smokeless tobacco: Never  Substance and Sexual Activity   Alcohol use: Not Currently    Comment: occasional wine   Drug use: Never   Sexual activity: Not on file  Other Topics Concern   Not on file  Social History Narrative   Not on file   Social Drivers of Health   Financial Resource Strain: Low Risk  (01/20/2023)   Received from [facility]   Overall Financial Resource Strain (CARDIA)    Difficulty of Paying Living Expenses: Not hard at all  Food Insecurity: No Food Insecurity (01/20/2023)   Received from [facility]   Hunger Vital Sign    Worried About Running Out of Food in the Last Year: Never true    Ran Out of Food in the Last Year: Never true  Transportation Needs: No Transportation Needs (01/20/2023)   Received from [facility] - Transportation    In the past 12 months, has lack of transportation kept you  from medical appointments or from getting medications?: No    Lack of Transportation (Non-Medical): No  Physical Activity: Not on file  Stress: Not on file  Social Connections: Not on file  Intimate Partner Violence: Not on file    Review of Systems: See HPI, otherwise negative ROS  Physical Exam: BP (!) 171/85   Pulse 88   Temp (!) 97.5 F (36.4 C) (Temporal)   Resp 19   Ht 5' 7.5" (1.715 m)   Wt 122.9 kg   SpO2 95%   BMI 41.82 kg/m  General:   Alert, cooperative in NAD Head:  Normocephalic and atraumatic. Respiratory:  Normal work of breathing. Cardiovascular:  RRR  Impression/Plan: Monica Reid is here for cataract surgery.  Risks, benefits, limitations, and alternatives regarding cataract surgery have been reviewed with the patient.  Questions have been answered.  All parties agreeable.   Galen Manila, MD  08/24/2023, 11:54 AM

## 2023-08-26 ENCOUNTER — Encounter: Payer: Self-pay | Admitting: Ophthalmology

## 2023-08-27 NOTE — Anesthesia Preprocedure Evaluation (Addendum)
 Anesthesia Evaluation    Airway Mallampati: IV  TM Distance: <3 FB     Dental no notable dental hx.    Pulmonary           Cardiovascular      Neuro/Psych    GI/Hepatic   Endo/Other    Renal/GU      Musculoskeletal   Abdominal   Peds  Hematology   Anesthesia Other Findings Sleep apnea  Arthritis Vertigo Morbid obesity (HCC) Obstructive sleep apnea on CPAP  Previous cataract surgery 08-24-23 Dr. Juel Burrow anesthesiologist     Reproductive/Obstetrics                              Anesthesia Physical Anesthesia Plan  ASA: 2  Anesthesia Plan: MAC   Post-op Pain Management:    Induction: Intravenous  PONV Risk Score and Plan:   Airway Management Planned: Natural Airway and Nasal Cannula  Additional Equipment:   Intra-op Plan:   Post-operative Plan:   Informed Consent: I have reviewed the patients History and Physical, chart, labs and discussed the procedure including the risks, benefits and alternatives for the proposed anesthesia with the patient or authorized representative who has indicated his/her understanding and acceptance.     Dental Advisory Given  Plan Discussed with: Anesthesiologist, CRNA and Surgeon  Anesthesia Plan Comments: (Patient consented for risks of anesthesia including but not limited to:  - adverse reactions to medications - damage to eyes, teeth, lips or other oral mucosa - nerve damage due to positioning  - sore throat or hoarseness - Damage to heart, brain, nerves, lungs, other parts of body or loss of life  Patient voiced understanding and assent.)         Anesthesia Quick Evaluation

## 2023-09-03 NOTE — Discharge Instructions (Signed)

## 2023-09-07 ENCOUNTER — Ambulatory Visit
Admission: RE | Admit: 2023-09-07 | Discharge: 2023-09-07 | Disposition: A | Discharge status: Home / Self Care | Payer: Medicare HMO | Attending: Ophthalmology | Admitting: Ophthalmology

## 2023-09-07 ENCOUNTER — Ambulatory Visit: Payer: Self-pay | Admitting: Anesthesiology

## 2023-09-07 ENCOUNTER — Encounter
Admission: RE | Disposition: A | Discharge status: Home / Self Care | Payer: Self-pay | Source: Home / Self Care | Attending: Ophthalmology

## 2023-09-07 ENCOUNTER — Other Ambulatory Visit: Payer: Self-pay

## 2023-09-07 ENCOUNTER — Encounter: Payer: Self-pay | Admitting: Ophthalmology

## 2023-09-07 DIAGNOSIS — H2511 Age-related nuclear cataract, right eye: Secondary | ICD-10-CM | POA: Insufficient documentation

## 2023-09-07 HISTORY — PX: CATARACT EXTRACTION W/PHACO: SHX586

## 2023-09-07 SURGERY — PHACOEMULSIFICATION, CATARACT, WITH IOL INSERTION
Anesthesia: Monitor Anesthesia Care | Site: Eye | Laterality: Right

## 2023-09-07 MED ORDER — MIDAZOLAM HCL 2 MG/2ML IJ SOLN
INTRAMUSCULAR | Status: DC | PRN
  Administered 2023-09-07: 2 mg via INTRAVENOUS

## 2023-09-07 MED ORDER — FENTANYL CITRATE (PF) 100 MCG/2ML IJ SOLN
INTRAMUSCULAR | Status: AC
Start: 2023-09-07 — End: ?
  Filled 2023-09-07: qty 2

## 2023-09-07 MED ORDER — SIGHTPATH DOSE#1 BSS IO SOLN
INTRAOCULAR | Status: DC | PRN
  Administered 2023-09-07: 15 mL via INTRAOCULAR

## 2023-09-07 MED ORDER — MIDAZOLAM HCL 2 MG/2ML IJ SOLN
INTRAMUSCULAR | Status: AC
  Filled 2023-09-07: qty 2

## 2023-09-07 MED ORDER — SIGHTPATH DOSE#1 NA CHONDROIT SULF-NA HYALURON 40-17 MG/ML IO SOLN
INTRAOCULAR | Status: DC | PRN
  Administered 2023-09-07: 1 mL via INTRAOCULAR

## 2023-09-07 MED ORDER — EPHEDRINE 5 MG/ML INJ
INTRAVENOUS | Status: AC
  Filled 2023-09-07: qty 5

## 2023-09-07 MED ORDER — BRIMONIDINE TARTRATE-TIMOLOL 0.2-0.5 % OP SOLN
OPHTHALMIC | Status: DC | PRN
  Administered 2023-09-07: 1 [drp] via OPHTHALMIC

## 2023-09-07 MED ORDER — MOXIFLOXACIN HCL 0.5 % OP SOLN
OPHTHALMIC | Status: DC | PRN
  Administered 2023-09-07: .2 mL via OPHTHALMIC

## 2023-09-07 MED ORDER — ARMC OPHTHALMIC DILATING DROPS
OPHTHALMIC | Status: AC
  Filled 2023-09-07: qty 0.5

## 2023-09-07 MED ORDER — ARMC OPHTHALMIC DILATING DROPS
1.0000 | OPHTHALMIC | Status: DC | PRN
  Administered 2023-09-07 (×3): 1 via OPHTHALMIC

## 2023-09-07 MED ORDER — ONDANSETRON HCL 4 MG/2ML IJ SOLN: 4.0000 mg | Freq: Once | INTRAMUSCULAR | Status: DC

## 2023-09-07 MED ORDER — LIDOCAINE HCL (PF) 2 % IJ SOLN
INTRAOCULAR | Status: DC | PRN
  Administered 2023-09-07: 2 mL

## 2023-09-07 MED ORDER — TETRACAINE HCL 0.5 % OP SOLN
1.0000 [drp] | OPHTHALMIC | Status: DC | PRN
  Administered 2023-09-07 (×3): 1 [drp] via OPHTHALMIC

## 2023-09-07 MED ORDER — FENTANYL CITRATE (PF) 100 MCG/2ML IJ SOLN
INTRAMUSCULAR | Status: DC | PRN
Start: 2023-09-07 — End: 2023-09-07
  Administered 2023-09-07: 25 ug via INTRAVENOUS
  Administered 2023-09-07: 50 ug via INTRAVENOUS
  Administered 2023-09-07: 25 ug via INTRAVENOUS

## 2023-09-07 MED ORDER — SIGHTPATH DOSE#1 BSS IO SOLN
INTRAOCULAR | Status: DC | PRN
  Administered 2023-09-07: 42 mL via OPHTHALMIC

## 2023-09-07 SURGICAL SUPPLY — 13 items
CATARACT SUITE SIGHTPATH (MISCELLANEOUS) ×1 IMPLANT
CYSTOTOME ANG REV CUT SHRT 25G (CUTTER) ×1 IMPLANT
CYSTOTOME ANGL RVRS SHRT 25G (CUTTER) ×1 IMPLANT
FEE CATARACT SUITE SIGHTPATH (MISCELLANEOUS) ×1 IMPLANT
GLOVE BIOGEL PI IND STRL 8 (GLOVE) ×1 IMPLANT
GLOVE SURG LX STRL 8.0 MICRO (GLOVE) ×1 IMPLANT
GLOVE SURG PROTEXIS BL SZ6.5 (GLOVE) ×1 IMPLANT
GLOVE SURG SYN 6.5 PF PI BL (GLOVE) ×1 IMPLANT
LENS CLRN VIVITY TORIC 25.5 ×1 IMPLANT
LENS IOL CLRN VT TRC 3 25.5 IMPLANT
NDL FILTER BLUNT 18X1 1/2 (NEEDLE) ×1 IMPLANT
NEEDLE FILTER BLUNT 18X1 1/2 (NEEDLE) ×1 IMPLANT
SYR 3ML LL SCALE MARK (SYRINGE) ×1 IMPLANT

## 2023-09-07 NOTE — H&P (Signed)
 [facility]   Primary Care Physician:  Jerl Mina, MD Ophthalmologist: Dr. Druscilla Brownie  Pre-Procedure History & Physical: HPI:  Monica Reid is a 73 y.o. female here for cataract surgery.   Past Medical History:  Diagnosis Date   Arthritis    left knee   Morbid obesity (HCC)    Obstructive sleep apnea on CPAP    Sleep apnea    uses CPAP   Vertigo 2006    Past Surgical History:  Procedure Laterality Date   BUNIONECTOMY Right    CATARACT EXTRACTION W/PHACO Left 08/24/2023   Procedure: CATARACT EXTRACTION PHACO AND INTRAOCULAR LENS PLACEMENT (IOC) LEFT  CLAREON VIVITY TORIC 9.52 00:48.1;  Surgeon: Galen Manila, MD;  Location: [facility];  Service: Ophthalmology;  Laterality: Left;   COLONOSCOPY      Prior to Admission medications   Not on File    Allergies as of 08/05/2023 - Review Complete 11/20/2016  Allergen Reaction Noted   Sulfa antibiotics Rash 05/14/2014    Family History  Problem Relation Age of Onset   Breast cancer Neg Hx     Social History   Socioeconomic History   Marital status: Legally Separated    Spouse name: Not on file   Number of children: Not on file   Years of education: Not on file   Highest education level: Not on file  Occupational History   Not on file  Tobacco Use   Smoking status: Unknown   Smokeless tobacco: Never  Substance and Sexual Activity   Alcohol use: Not Currently    Comment: occasional wine   Drug use: Never   Sexual activity: Not on file  Other Topics Concern   Not on file  Social History Narrative   Not on file   Social Drivers of Health   Financial Resource Strain: Low Risk  (01/20/2023)   Received from [facility]   Overall Financial Resource Strain (CARDIA)    Difficulty of Paying Living Expenses: Not hard at all  Food Insecurity: No Food Insecurity (01/20/2023)   Received from [facility]   Hunger Vital Sign    Worried About Running Out of Food in  the Last Year: Never true    Ran Out of Food in the Last Year: Never true  Transportation Needs: No Transportation Needs (01/20/2023)   Received from [facility] - Transportation    In the past 12 months, has lack of transportation kept you from medical appointments or from getting medications?: No    Lack of Transportation (Non-Medical): No  Physical Activity: Not on file  Stress: Not on file  Social Connections: Not on file  Intimate Partner Violence: Not on file    Review of Systems: See HPI, otherwise negative ROS  Physical Exam: BP 138/71   Pulse 86   Temp 98.1 F (36.7 C) (Temporal)   Resp 18   Wt 122.9 kg   SpO2 94%   BMI 41.82 kg/m  General:   Alert, cooperative in NAD Head:  Normocephalic and atraumatic. Respiratory:  Normal work of breathing. Cardiovascular:  RRR  Impression/Plan: Monica Reid is here for cataract surgery.  Risks, benefits, limitations, and alternatives regarding cataract surgery have been reviewed with the patient.  Questions have been answered.  All parties agreeable.   Galen Manila, MD  09/07/2023, 9:04 AM

## 2023-09-07 NOTE — Op Note (Signed)
 PREOPERATIVE DIAGNOSIS:  Nuclear sclerotic cataract of the right eye.   POSTOPERATIVE DIAGNOSIS:  Nuclear sclerotic cataract of the right eye.   OPERATIVE PROCEDURE: Procedure(s): CATARACT EXTRACTION PHACO AND INTRAOCULAR LENS PLACEMENT (IOC) RIGHT  CLAREON VIVITY TORIC 6.12 00:41.9   SURGEON:  Galen Manila, MD.   ANESTHESIA: 1.      Managed anesthesia care. 2.     0.48ml of Shugarcaine was instilled following the paracentesis  Anesthesiologist: Marisue Humble, MD CRNA: Deland Pretty, CRNA  COMPLICATIONS:  None.   TECHNIQUE:   Stop and chop    DESCRIPTION OF PROCEDURE:  The patient was examined and consented in the preoperative holding area where the aforementioned topical anesthesia was applied to the right eye.  The patient was brought back to the Operating Room where he was sat upright on the gurney and given a target to fixate upon while the eye was marked at the 3:00 and 9:00 position.  The patient was then reclined on the operating table.  The eye was prepped and draped in the usual sterile ophthalmic fashion and a lid speculum was placed. A paracentesis was created with the side port blade and the anterior chamber was filled with viscoelastic. A near clear corneal incision was performed with the steel keratome. A continuous curvilinear capsulorrhexis was performed with a cystotome followed by the capsulorrhexis forceps. Hydrodissection and hydrodelineation were carried out with BSS on a blunt cannula. The lens was removed in a stop and chop technique and the remaining cortical material was removed with the irrigation-aspiration handpiece. The eye was inflated with viscoelastic and the CNWET3  lens  was placed in the eye and rotated to within a few degrees of the predetermined orientation.  The remaining viscoelastic was removed from the eye.  The Sinskey hook was used to rotate the toric lens into its final resting place at 180 degrees.  . The eye was inflated to a physiologic  pressure and found to be watertight. 0.77ml of Vigamox was placed in the anterior chamber.  The eye was dressed with Combigan.. The patient was given protective glasses to wear throughout the day and a shield with which to sleep tonight. The patient was also given drops with which to begin a drop regimen today and will follow-up with me in one day. * No implants in log * Procedure(s): CATARACT EXTRACTION PHACO AND INTRAOCULAR LENS PLACEMENT (IOC) RIGHT  CLAREON VIVITY TORIC 6.12 00:41.9 (Right)  Electronically signed: Galen Manila 09/07/2023 9:29 AM

## 2023-09-07 NOTE — Transfer of Care (Signed)
 Immediate Anesthesia Transfer of Care Note  Patient: Monica Reid  Procedure(s) Performed: CATARACT EXTRACTION PHACO AND INTRAOCULAR LENS PLACEMENT (IOC) RIGHT  CLAREON VIVITY TORIC 6.12 00:41.9 (Right: Eye)  Patient Location: PACU  Anesthesia Type: MAC  Level of Consciousness: awake, alert  and patient cooperative  Airway and Oxygen Therapy: Patient Spontanous Breathing and Patient connected to supplemental oxygen  Post-op Assessment: Post-op Vital signs reviewed, Patient's Cardiovascular Status Stable, Respiratory Function Stable, Patent Airway and No signs of Nausea or vomiting  Post-op Vital Signs: Reviewed and stable  Complications: No notable events documented.

## 2023-09-07 NOTE — Anesthesia Postprocedure Evaluation (Signed)
 Anesthesia Post Note  Patient: Monica Reid  Procedure(s) Performed: CATARACT EXTRACTION PHACO AND INTRAOCULAR LENS PLACEMENT (IOC) RIGHT  CLAREON VIVITY TORIC 6.12 00:41.9 (Right: Eye)  Patient location during evaluation: PACU Anesthesia Type: MAC Level of consciousness: awake and alert Pain management: pain level controlled Vital Signs Assessment: post-procedure vital signs reviewed and stable Respiratory status: spontaneous breathing, nonlabored ventilation, respiratory function stable and patient connected to nasal cannula oxygen Cardiovascular status: stable and blood pressure returned to baseline Postop Assessment: no apparent nausea or vomiting Anesthetic complications: no   No notable events documented.   Last Vitals:  Vitals:   09/07/23 0928 09/07/23 0932  BP: 133/64 132/69  Pulse: 80 80  Resp: 12 20  Temp: (!) 36.2 C (!) 36.2 C  SpO2: 94% 94%    Last Pain:  Vitals:   09/07/23 0932  TempSrc:   PainSc: 0-No pain                 Shavonne Ambroise C Keldric Poyer

## 2023-09-08 ENCOUNTER — Encounter: Payer: Self-pay | Admitting: Ophthalmology

## 2023-11-29 ENCOUNTER — Other Ambulatory Visit: Payer: Self-pay | Admitting: Family Medicine

## 2023-11-29 DIAGNOSIS — Z1231 Encounter for screening mammogram for malignant neoplasm of breast: Secondary | ICD-10-CM

## 2023-11-30 ENCOUNTER — Other Ambulatory Visit: Payer: Self-pay | Admitting: Family Medicine

## 2023-11-30 DIAGNOSIS — Z0000 Encounter for general adult medical examination without abnormal findings: Secondary | ICD-10-CM

## 2023-11-30 DIAGNOSIS — I429 Cardiomyopathy, unspecified: Secondary | ICD-10-CM

## 2023-12-13 ENCOUNTER — Ambulatory Visit
Admission: RE | Admit: 2023-12-13 | Discharge: 2023-12-13 | Disposition: A | Discharge status: Home / Self Care | Source: Ambulatory Visit | Attending: Family Medicine | Admitting: Family Medicine

## 2023-12-13 DIAGNOSIS — Z0000 Encounter for general adult medical examination without abnormal findings: Secondary | ICD-10-CM | POA: Insufficient documentation

## 2023-12-13 DIAGNOSIS — I429 Cardiomyopathy, unspecified: Secondary | ICD-10-CM | POA: Insufficient documentation

## 2023-12-13 DIAGNOSIS — Z1231 Encounter for screening mammogram for malignant neoplasm of breast: Secondary | ICD-10-CM | POA: Insufficient documentation
